# Patient Record
Sex: Male | Born: 1970 | Race: White | Hispanic: No | Marital: Married | Smoking: Never smoker
Health system: Southern US, Community
[De-identification: ages and names within clinical notes are randomized; demographics above are authoritative.]

---

## 2005-01-03 ENCOUNTER — Ambulatory Visit (HOSPITAL_COMMUNITY): Admission: RE | Admit: 2005-01-03 | Discharge: 2005-01-03 | Payer: Self-pay | Admitting: Family Medicine

## 2005-01-29 ENCOUNTER — Emergency Department (HOSPITAL_COMMUNITY): Admission: EM | Admit: 2005-01-29 | Discharge: 2005-01-29 | Payer: Self-pay | Admitting: *Deleted

## 2006-12-12 ENCOUNTER — Ambulatory Visit: Payer: Self-pay | Admitting: Family Medicine

## 2006-12-12 ENCOUNTER — Ambulatory Visit (HOSPITAL_COMMUNITY): Admission: RE | Admit: 2006-12-12 | Discharge: 2006-12-12 | Payer: Self-pay | Admitting: Family Medicine

## 2006-12-12 ENCOUNTER — Telehealth (INDEPENDENT_AMBULATORY_CARE_PROVIDER_SITE_OTHER): Payer: Self-pay | Admitting: *Deleted

## 2006-12-12 DIAGNOSIS — F329 Major depressive disorder, single episode, unspecified: Secondary | ICD-10-CM

## 2006-12-12 DIAGNOSIS — F411 Generalized anxiety disorder: Secondary | ICD-10-CM | POA: Insufficient documentation

## 2006-12-12 DIAGNOSIS — R5383 Other fatigue: Secondary | ICD-10-CM

## 2006-12-12 DIAGNOSIS — E785 Hyperlipidemia, unspecified: Secondary | ICD-10-CM

## 2006-12-12 DIAGNOSIS — K219 Gastro-esophageal reflux disease without esophagitis: Secondary | ICD-10-CM

## 2006-12-12 DIAGNOSIS — M25569 Pain in unspecified knee: Secondary | ICD-10-CM

## 2006-12-12 DIAGNOSIS — J45909 Unspecified asthma, uncomplicated: Secondary | ICD-10-CM | POA: Insufficient documentation

## 2006-12-12 DIAGNOSIS — I10 Essential (primary) hypertension: Secondary | ICD-10-CM | POA: Insufficient documentation

## 2006-12-12 DIAGNOSIS — R5381 Other malaise: Secondary | ICD-10-CM

## 2006-12-12 DIAGNOSIS — K589 Irritable bowel syndrome without diarrhea: Secondary | ICD-10-CM

## 2006-12-12 DIAGNOSIS — F3289 Other specified depressive episodes: Secondary | ICD-10-CM | POA: Insufficient documentation

## 2006-12-12 DIAGNOSIS — M129 Arthropathy, unspecified: Secondary | ICD-10-CM | POA: Insufficient documentation

## 2006-12-12 DIAGNOSIS — M2140 Flat foot [pes planus] (acquired), unspecified foot: Secondary | ICD-10-CM | POA: Insufficient documentation

## 2006-12-12 LAB — CONVERTED CEMR LAB
HDL goal, serum: 40 mg/dL
LDL Goal: 130 mg/dL

## 2006-12-13 ENCOUNTER — Telehealth (INDEPENDENT_AMBULATORY_CARE_PROVIDER_SITE_OTHER): Payer: Self-pay | Admitting: *Deleted

## 2006-12-14 ENCOUNTER — Encounter (INDEPENDENT_AMBULATORY_CARE_PROVIDER_SITE_OTHER): Payer: Self-pay | Admitting: Family Medicine

## 2006-12-21 ENCOUNTER — Encounter (INDEPENDENT_AMBULATORY_CARE_PROVIDER_SITE_OTHER): Payer: Self-pay | Admitting: Family Medicine

## 2006-12-23 LAB — CONVERTED CEMR LAB
AST: 46 units/L — ABNORMAL HIGH (ref 0–37)
Basophils Relative: 1 % (ref 0–1)
Calcium: 9.3 mg/dL (ref 8.4–10.5)
Chloride: 104 meq/L (ref 96–112)
Creatinine, Ser: 0.97 mg/dL (ref 0.40–1.50)
Eosinophils Absolute: 0.1 10*3/uL (ref 0.0–0.7)
Eosinophils Relative: 1 % (ref 0–5)
HDL: 49 mg/dL (ref >39)
Lymphs Abs: 3.8 10*3/uL — ABNORMAL HIGH (ref 0.7–3.3)
MCHC: 33.8 g/dL (ref 30.0–36.0)
MCV: 91.1 fL (ref 78.0–100.0)
Monocytes Absolute: 0.6 10*3/uL (ref 0.2–0.7)
Neutro Abs: 3.9 10*3/uL (ref 1.7–7.7)
Neutrophils Relative %: 46 % (ref 43–77)
Sodium: 138 meq/L (ref 135–145)
TSH: 2.591 microintl units/mL (ref 0.350–5.50)
Total CHOL/HDL Ratio: 4.2
Total Protein: 7.1 g/dL (ref 6.0–8.3)
VLDL: 26 mg/dL (ref 0–40)

## 2006-12-24 ENCOUNTER — Telehealth (INDEPENDENT_AMBULATORY_CARE_PROVIDER_SITE_OTHER): Payer: Self-pay | Admitting: Family Medicine

## 2006-12-25 ENCOUNTER — Ambulatory Visit: Payer: Self-pay | Admitting: Family Medicine

## 2006-12-25 ENCOUNTER — Telehealth (INDEPENDENT_AMBULATORY_CARE_PROVIDER_SITE_OTHER): Payer: Self-pay | Admitting: *Deleted

## 2006-12-25 DIAGNOSIS — G43909 Migraine, unspecified, not intractable, without status migrainosus: Secondary | ICD-10-CM | POA: Insufficient documentation

## 2006-12-25 DIAGNOSIS — R945 Abnormal results of liver function studies: Secondary | ICD-10-CM | POA: Insufficient documentation

## 2006-12-26 ENCOUNTER — Telehealth (INDEPENDENT_AMBULATORY_CARE_PROVIDER_SITE_OTHER): Payer: Self-pay | Admitting: *Deleted

## 2006-12-26 ENCOUNTER — Encounter (INDEPENDENT_AMBULATORY_CARE_PROVIDER_SITE_OTHER): Payer: Self-pay | Admitting: Family Medicine

## 2007-01-09 ENCOUNTER — Encounter (INDEPENDENT_AMBULATORY_CARE_PROVIDER_SITE_OTHER): Payer: Self-pay | Admitting: Family Medicine

## 2007-01-25 ENCOUNTER — Telehealth (INDEPENDENT_AMBULATORY_CARE_PROVIDER_SITE_OTHER): Payer: Self-pay | Admitting: *Deleted

## 2007-01-28 ENCOUNTER — Telehealth (INDEPENDENT_AMBULATORY_CARE_PROVIDER_SITE_OTHER): Payer: Self-pay | Admitting: *Deleted

## 2007-02-01 ENCOUNTER — Ambulatory Visit (HOSPITAL_COMMUNITY): Admission: RE | Admit: 2007-02-01 | Discharge: 2007-02-01 | Payer: Self-pay | Admitting: Family Medicine

## 2007-02-01 ENCOUNTER — Telehealth (INDEPENDENT_AMBULATORY_CARE_PROVIDER_SITE_OTHER): Payer: Self-pay | Admitting: *Deleted

## 2007-02-04 ENCOUNTER — Encounter (INDEPENDENT_AMBULATORY_CARE_PROVIDER_SITE_OTHER): Payer: Self-pay | Admitting: Internal Medicine

## 2007-02-08 ENCOUNTER — Ambulatory Visit: Payer: Self-pay | Admitting: Family Medicine

## 2007-02-10 LAB — CONVERTED CEMR LAB
ALT: 100 U/L — ABNORMAL HIGH
AST: 46 U/L — ABNORMAL HIGH
Albumin: 4.3 g/dL
Alkaline Phosphatase: 65 U/L
Bilirubin, Direct: 0.1 mg/dL
Indirect Bilirubin: 0.3 mg/dL
Total Bilirubin: 0.4 mg/dL
Total Protein: 7.1 g/dL

## 2007-02-11 ENCOUNTER — Telehealth (INDEPENDENT_AMBULATORY_CARE_PROVIDER_SITE_OTHER): Payer: Self-pay | Admitting: *Deleted

## 2007-02-11 ENCOUNTER — Encounter (INDEPENDENT_AMBULATORY_CARE_PROVIDER_SITE_OTHER): Payer: Self-pay | Admitting: Family Medicine

## 2007-03-17 ENCOUNTER — Encounter (INDEPENDENT_AMBULATORY_CARE_PROVIDER_SITE_OTHER): Payer: Self-pay | Admitting: Family Medicine

## 2007-04-26 ENCOUNTER — Ambulatory Visit: Payer: Self-pay | Admitting: Family Medicine

## 2007-12-16 ENCOUNTER — Encounter (INDEPENDENT_AMBULATORY_CARE_PROVIDER_SITE_OTHER): Payer: Self-pay | Admitting: Family Medicine

## 2007-12-25 LAB — CONVERTED CEMR LAB
ALT: 137 units/L — ABNORMAL HIGH (ref 0–53)
AST: 96 units/L — ABNORMAL HIGH (ref 0–37)
Albumin: 4.3 g/dL (ref 3.5–5.2)
BUN: 10 mg/dL (ref 6–23)
Calcium: 9.4 mg/dL (ref 8.4–10.5)
Chloride: 102 meq/L (ref 96–112)
Cholesterol: 254 mg/dL — ABNORMAL HIGH (ref 0–200)
Glucose, Bld: 64 mg/dL — ABNORMAL LOW (ref 70–99)
HDL: 46 mg/dL (ref >39)
Sodium: 139 meq/L (ref 135–145)
Total Bilirubin: 0.5 mg/dL (ref 0.3–1.2)
Total CHOL/HDL Ratio: 5.5
Total Protein: 7.2 g/dL (ref 6.0–8.3)

## 2008-01-08 ENCOUNTER — Ambulatory Visit: Payer: Self-pay | Admitting: Family Medicine

## 2008-01-15 ENCOUNTER — Ambulatory Visit (HOSPITAL_COMMUNITY): Admission: RE | Admit: 2008-01-15 | Discharge: 2008-01-15 | Payer: Self-pay | Admitting: Family Medicine

## 2008-01-16 ENCOUNTER — Encounter (INDEPENDENT_AMBULATORY_CARE_PROVIDER_SITE_OTHER): Payer: Self-pay | Admitting: Family Medicine

## 2008-01-21 ENCOUNTER — Telehealth (INDEPENDENT_AMBULATORY_CARE_PROVIDER_SITE_OTHER): Payer: Self-pay | Admitting: *Deleted

## 2008-01-21 ENCOUNTER — Encounter (INDEPENDENT_AMBULATORY_CARE_PROVIDER_SITE_OTHER): Payer: Self-pay | Admitting: Family Medicine

## 2008-01-21 LAB — CONVERTED CEMR LAB
Ceruloplasmin: 26 mg/dL (ref 21–63)
Saturation Ratios: 25 % (ref 20–55)
UIBC: 212 ug/dL

## 2008-01-31 ENCOUNTER — Ambulatory Visit: Payer: Self-pay | Admitting: Internal Medicine

## 2008-02-05 ENCOUNTER — Ambulatory Visit: Payer: Self-pay | Admitting: Family Medicine

## 2008-04-06 ENCOUNTER — Telehealth (INDEPENDENT_AMBULATORY_CARE_PROVIDER_SITE_OTHER): Payer: Self-pay | Admitting: *Deleted

## 2008-04-13 ENCOUNTER — Ambulatory Visit: Payer: Self-pay | Admitting: Family Medicine

## 2008-04-13 DIAGNOSIS — E669 Obesity, unspecified: Secondary | ICD-10-CM | POA: Insufficient documentation

## 2008-05-04 ENCOUNTER — Encounter (INDEPENDENT_AMBULATORY_CARE_PROVIDER_SITE_OTHER): Payer: Self-pay | Admitting: Family Medicine

## 2008-05-25 ENCOUNTER — Ambulatory Visit: Payer: Self-pay | Admitting: Family Medicine

## 2008-05-26 ENCOUNTER — Encounter (INDEPENDENT_AMBULATORY_CARE_PROVIDER_SITE_OTHER): Payer: Self-pay | Admitting: Family Medicine

## 2008-06-01 ENCOUNTER — Encounter (INDEPENDENT_AMBULATORY_CARE_PROVIDER_SITE_OTHER): Payer: Self-pay | Admitting: *Deleted

## 2008-06-01 LAB — CONVERTED CEMR LAB
ALT: 44 units/L (ref 0–53)
AST: 20 units/L (ref 0–37)
Calcium: 9.1 mg/dL (ref 8.4–10.5)
Creatinine, Ser: 0.83 mg/dL (ref 0.40–1.50)
Sodium: 142 meq/L (ref 135–145)
Total Protein: 7.1 g/dL (ref 6.0–8.3)

## 2008-09-22 IMAGING — CR DG KNEE COMPLETE 4+V*L*
4 series · 4 of 4 positions shown · non-contrast
Comparison: none

HISTORY: Bilateral knee pain

LEFT KNEE 4 VIEWS:
Mild medial compartment joint space narrowing.
Bone mineralization normal.
No fracture, dislocation, or bone destruction.
Slight patellofemoral joint space narrowing and question minimal knee joint
effusion.

[view not recorded (1 of 4)]
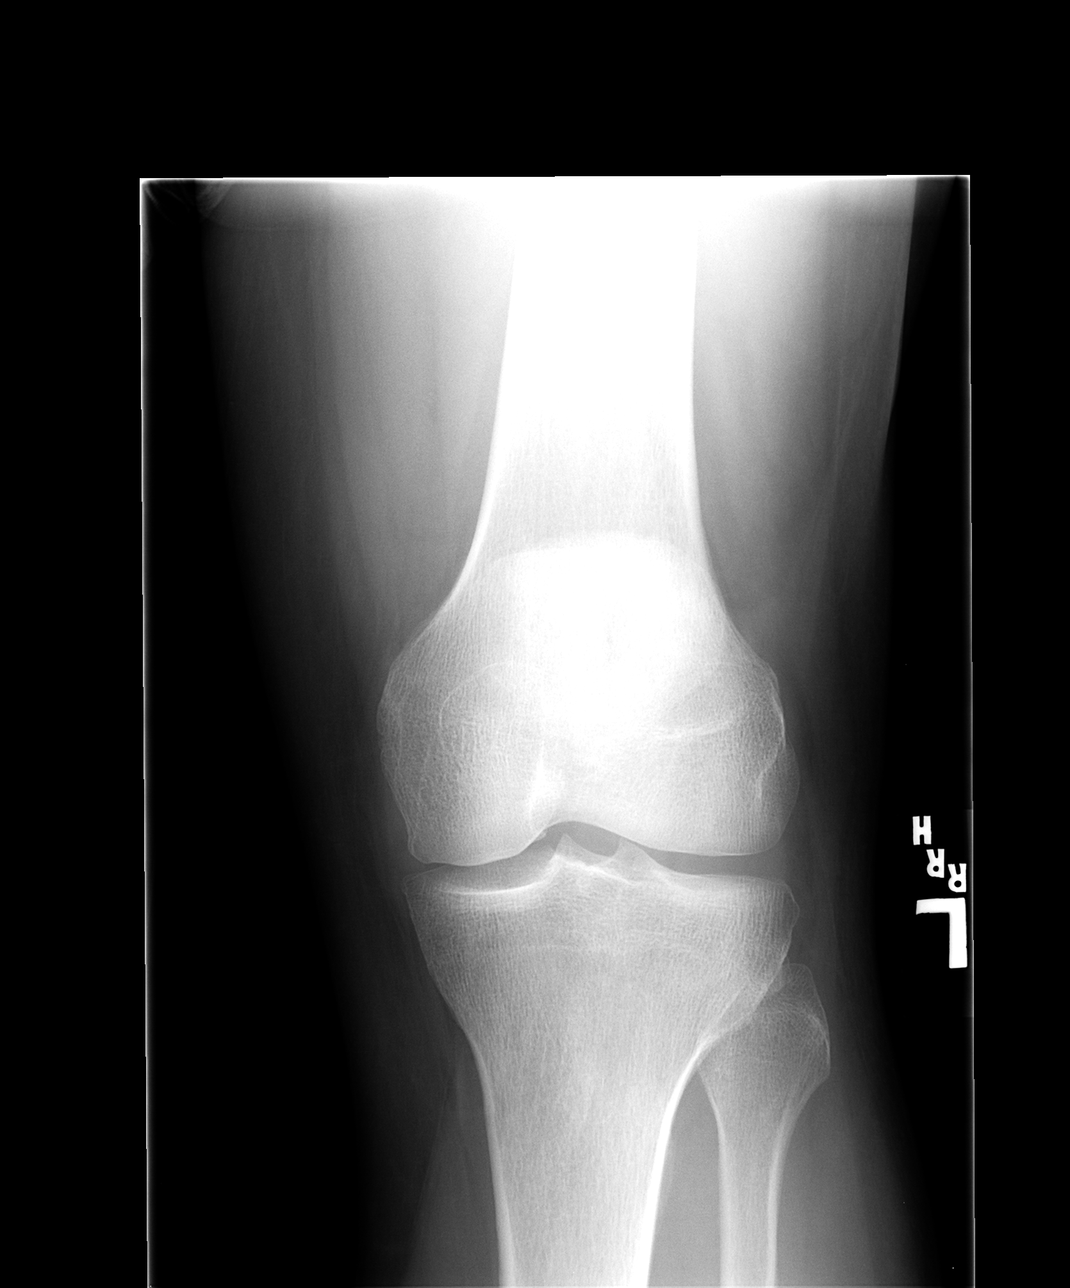

[view not recorded (2 of 4)]
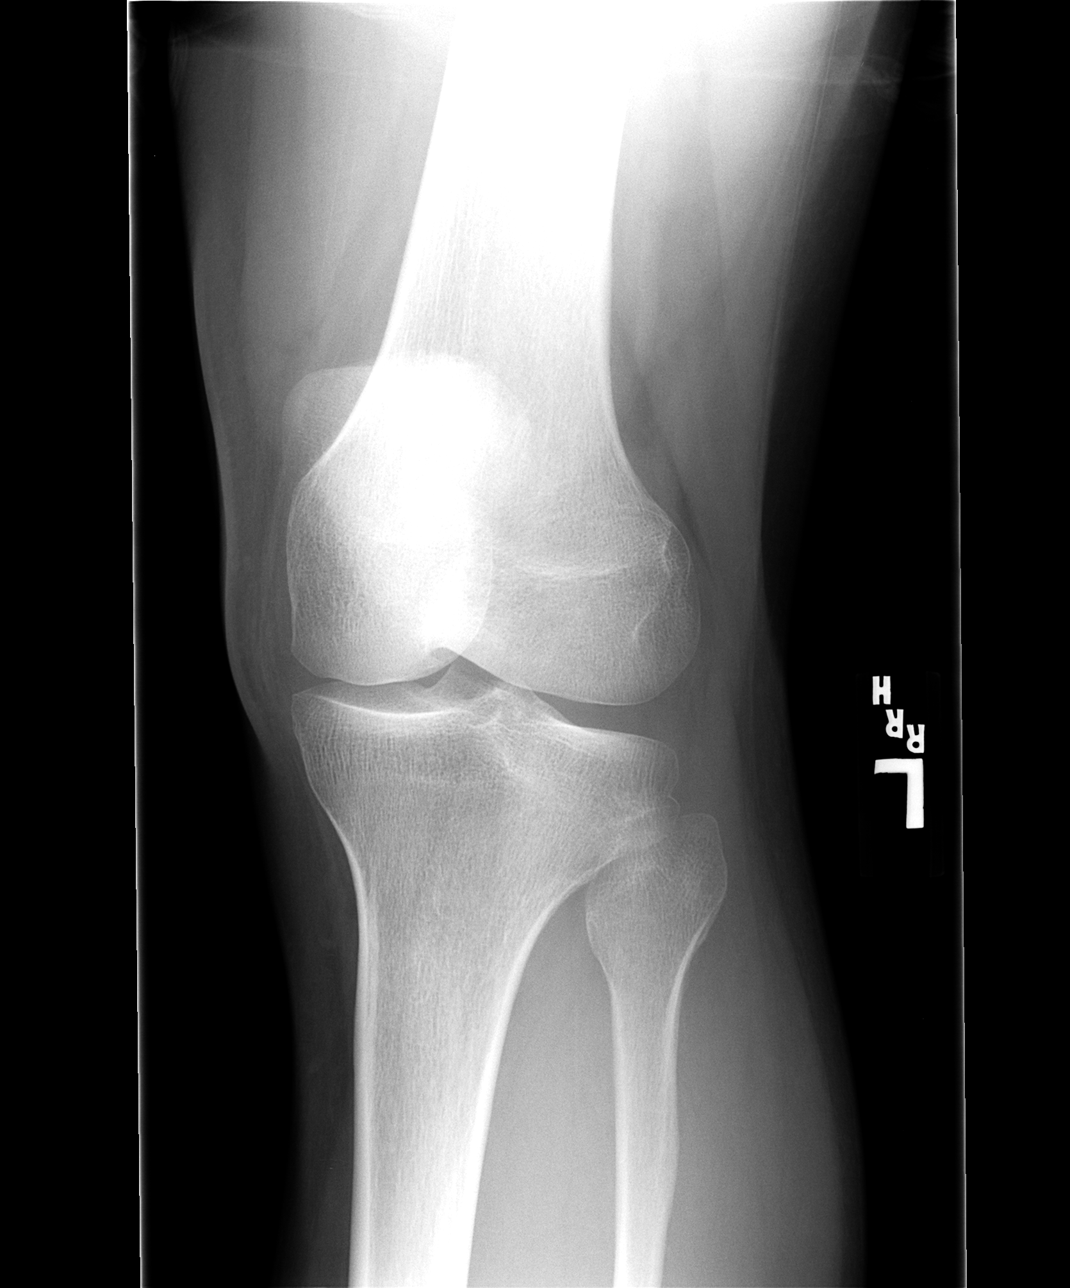

[view not recorded (3 of 4)]
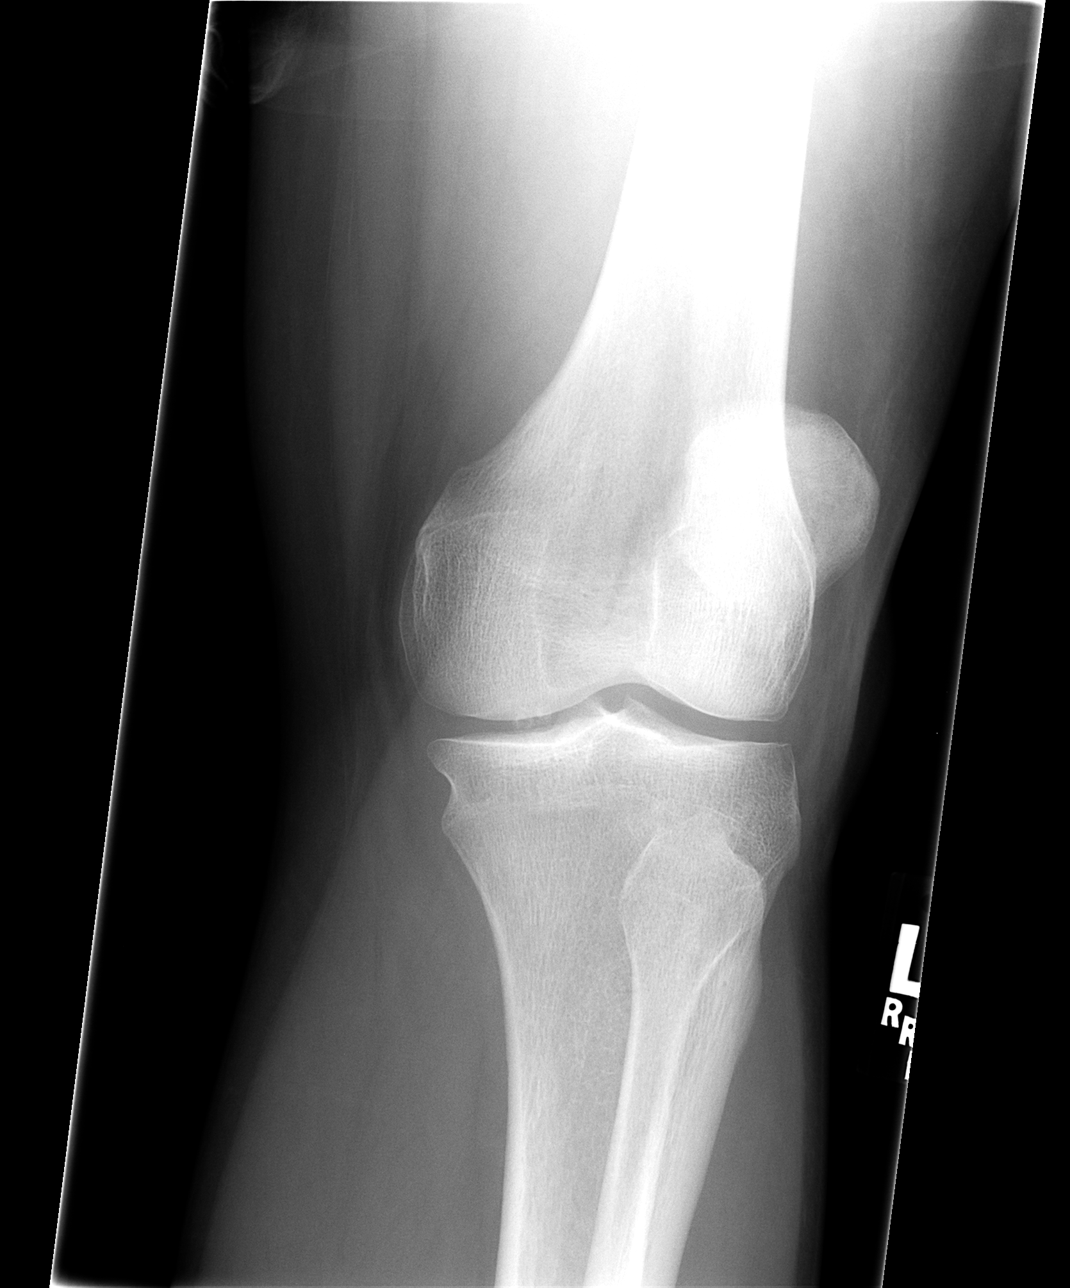

[view not recorded (4 of 4)]
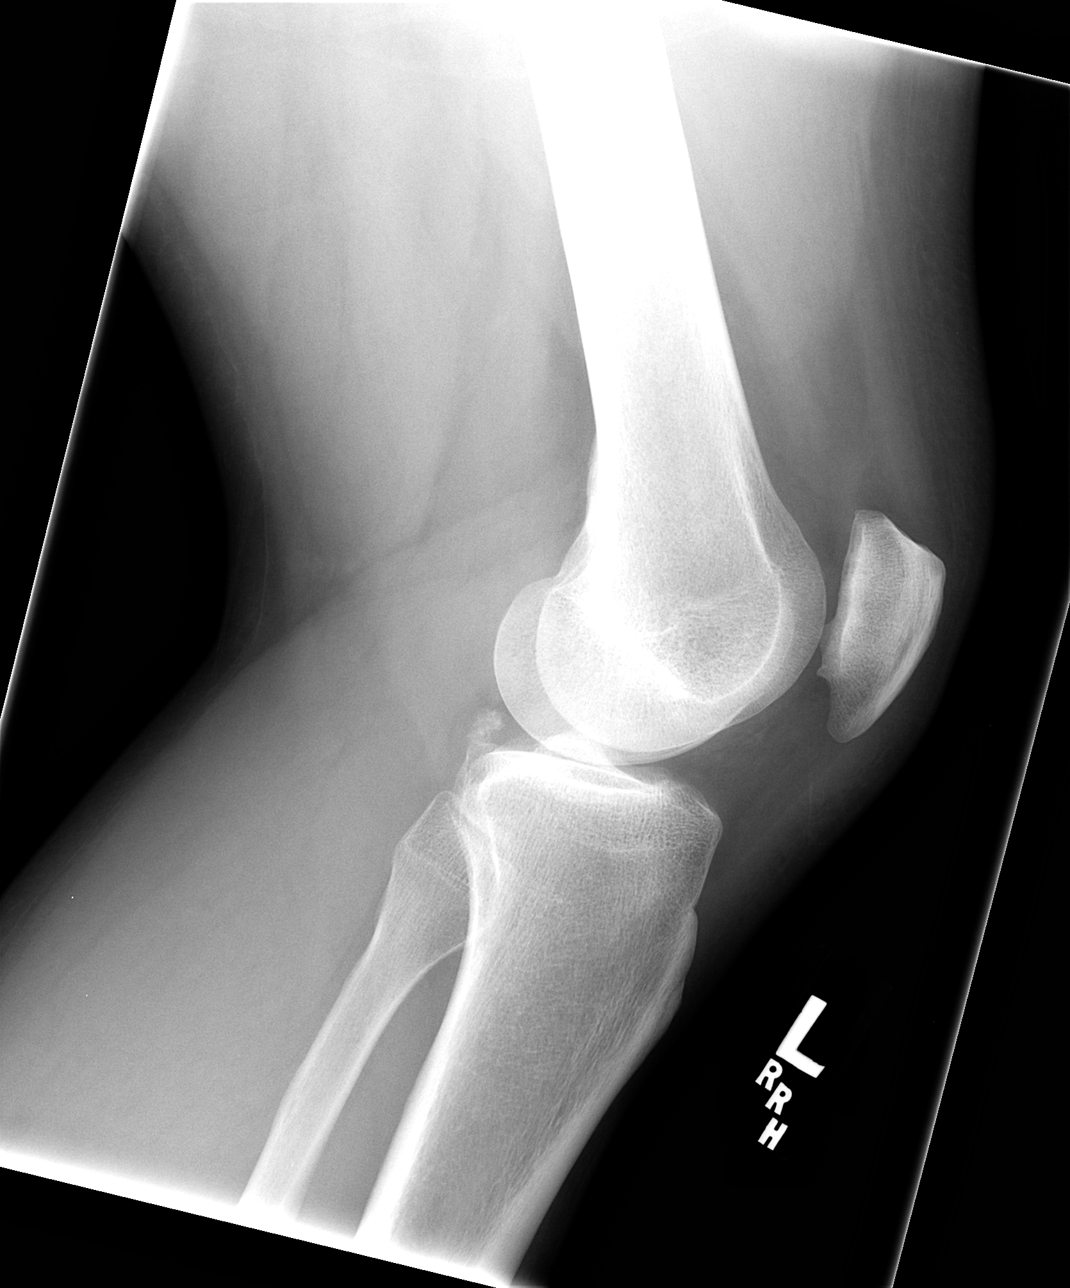

[4 of 4 positions shown; findings below may reference images not displayed]

IMPRESSION: Minimal degenerative changes left knee.

RIGHT KNEE 4 VIEWS:

Mild medial compartment joint space narrowing.
Mineralization normal.
No fracture, dislocation, or bone destruction.
No definite knee joint effusion or soft tissue abnormality.
IMPRESSION: Mild joint space narrowing medial compartment.

## 2008-11-09 ENCOUNTER — Ambulatory Visit: Payer: Self-pay | Admitting: Family Medicine

## 2008-11-10 ENCOUNTER — Encounter (INDEPENDENT_AMBULATORY_CARE_PROVIDER_SITE_OTHER): Payer: Self-pay | Admitting: Family Medicine

## 2008-11-11 LAB — CONVERTED CEMR LAB
Alkaline Phosphatase: 58 units/L (ref 39–117)
BUN: 12 mg/dL (ref 6–23)
Basophils Absolute: 0 10*3/uL (ref 0.0–0.1)
CO2: 21 meq/L (ref 19–32)
Creatinine, Ser: 0.89 mg/dL (ref 0.40–1.50)
Eosinophils Absolute: 0.1 10*3/uL (ref 0.0–0.7)
Eosinophils Relative: 1 % (ref 0–5)
HCT: 47.4 % (ref 39.0–52.0)
HDL: 47 mg/dL (ref >39)
Lymphocytes Relative: 34 % (ref 12–46)
Lymphs Abs: 2.1 10*3/uL (ref 0.7–4.0)
MCHC: 33.1 g/dL (ref 30.0–36.0)
Neutro Abs: 3.5 10*3/uL (ref 1.7–7.7)
Platelets: 241 10*3/uL (ref 150–400)
RDW: 12.9 % (ref 11.5–15.5)
TSH: 1.081 microintl units/mL (ref 0.350–4.500)
Total CHOL/HDL Ratio: 3.6
Total Protein: 7.4 g/dL (ref 6.0–8.3)
WBC: 6.1 10*3/uL (ref 4.0–10.5)

## 2008-11-12 IMAGING — CT CT HEAD W/O CM
1 series · 16 of 30 positions shown, 20 images · IV contrast (agent unspecified)
Comparison: none

HISTORY: Headaches, migraines

CT HEAD WITHOUT CONTRAST:
Routine noncontrast CT head without priors for comparison.
Normal ventricular morphology.
No midline shift or mass-effect.
Normal appearance of brain parenchyma.
No intracranial mass, hemorrhage, or infarction.
Visualized sinuses clear.
Bones unremarkable.

[Series 2: headseq 4.8 h37s · axial · 0.43mm/px · z∈[+71,+223]mm · 16 of 36 slices shown, 20 images]
[im 2/36  brain]
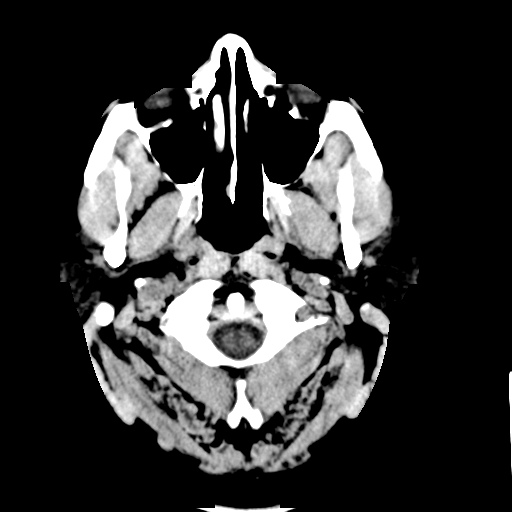
[im 2/36  bone]
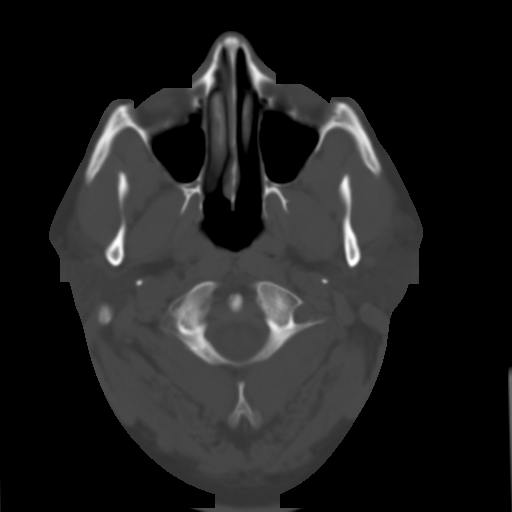
[im 4/36  brain]
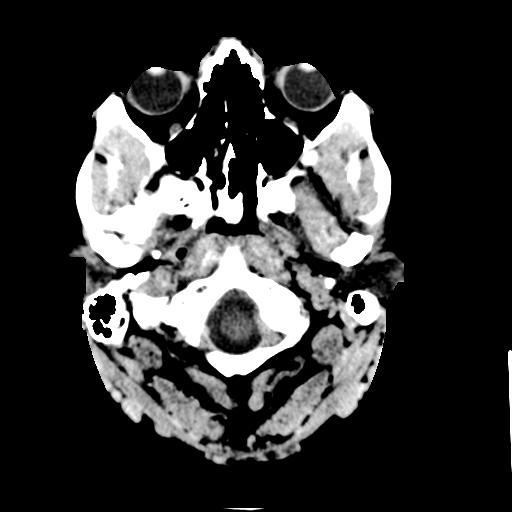
[im 7/36  brain]
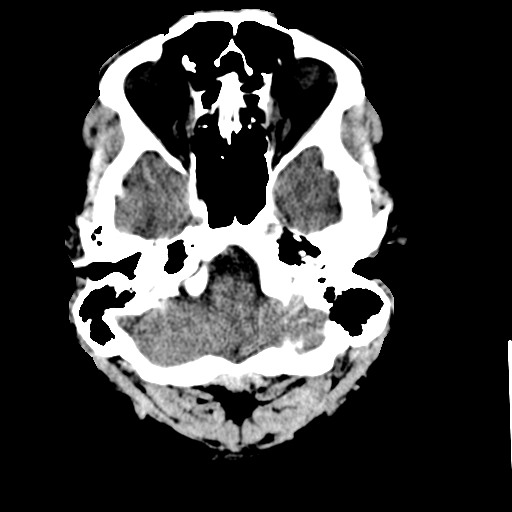
[im 9/36  brain]
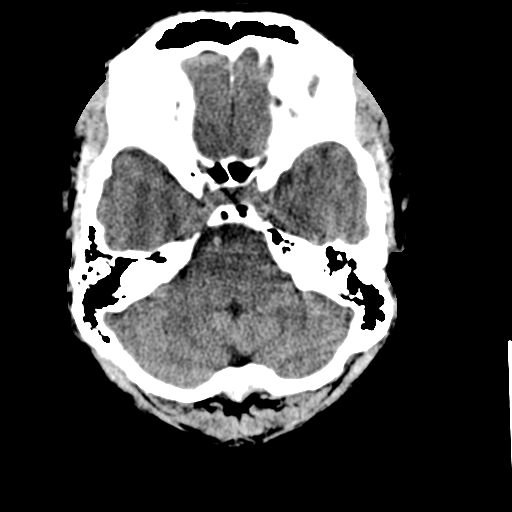
[im 10/36  brain]
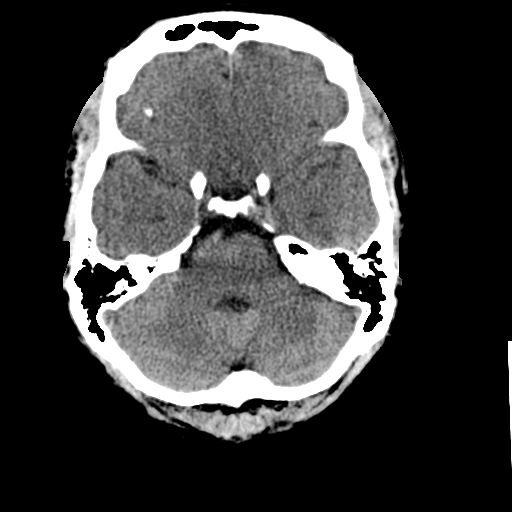
[im 10/36  bone]
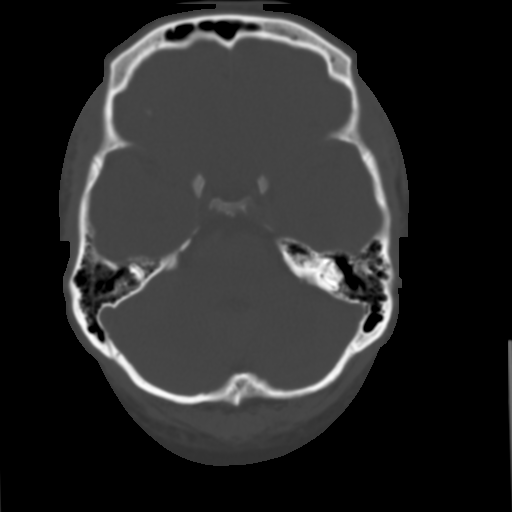
[im 13/36  brain]
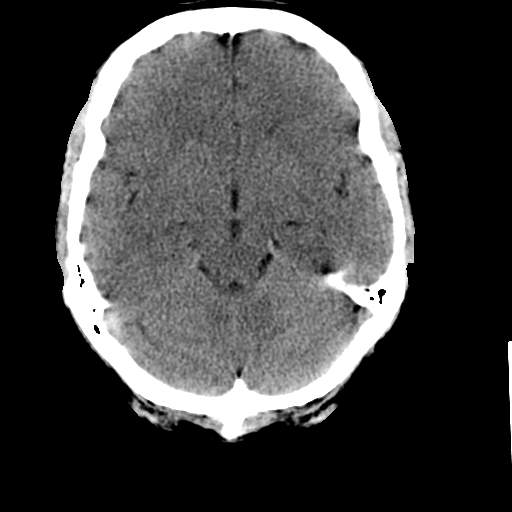
[im 15/36  brain]
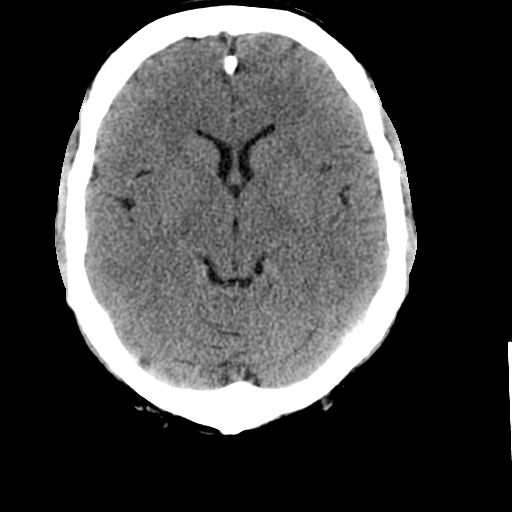
[im 17/36  brain]
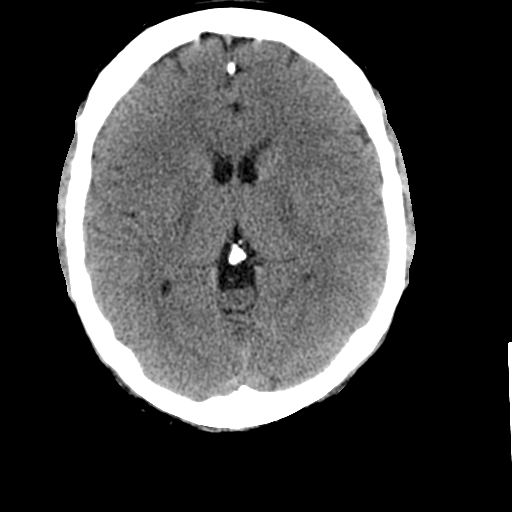
[im 19/36  brain]
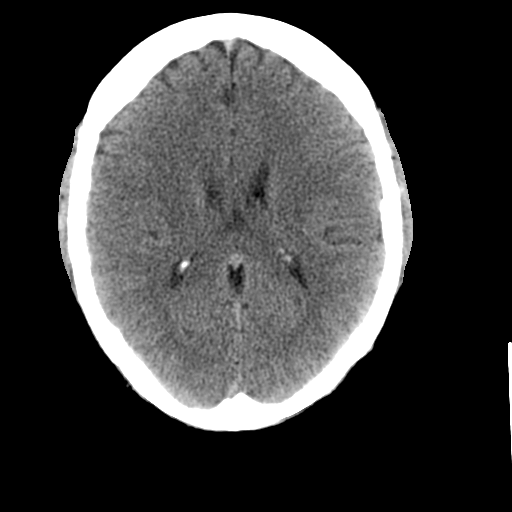
[im 19/36  bone]
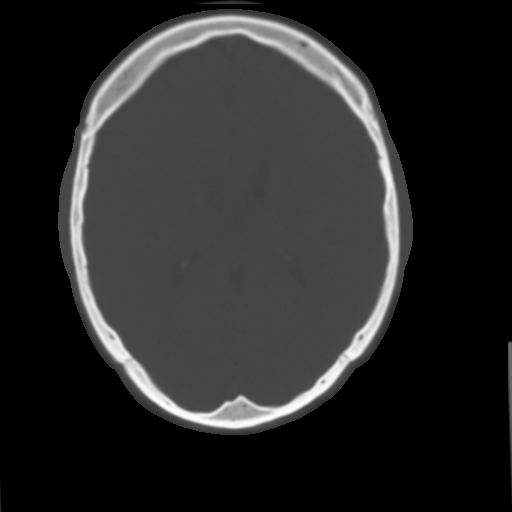
[im 21/36  brain]
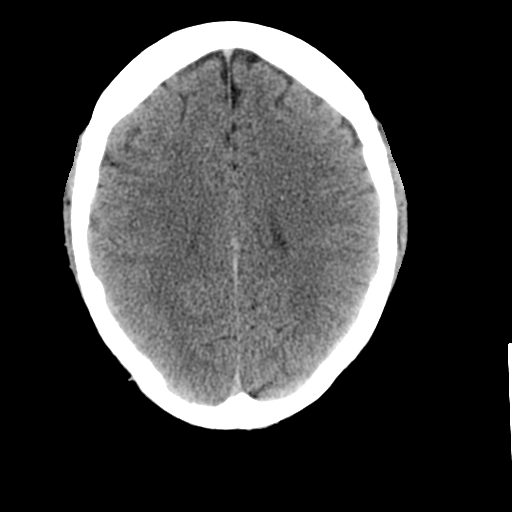
[im 23/36  brain]
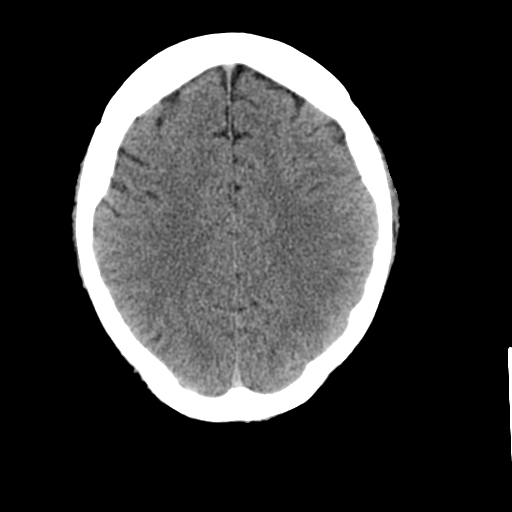
[im 26/36  brain]
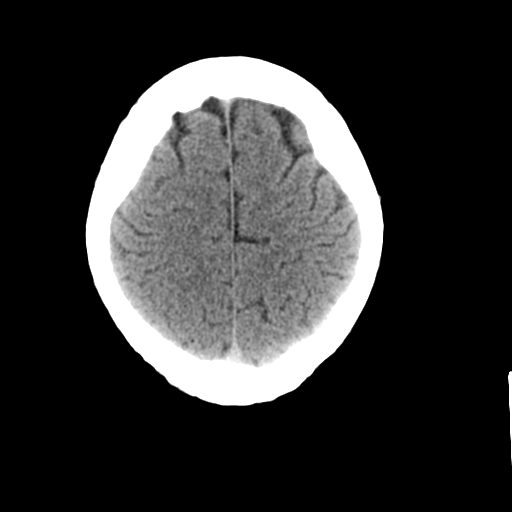
[im 27/36  brain]
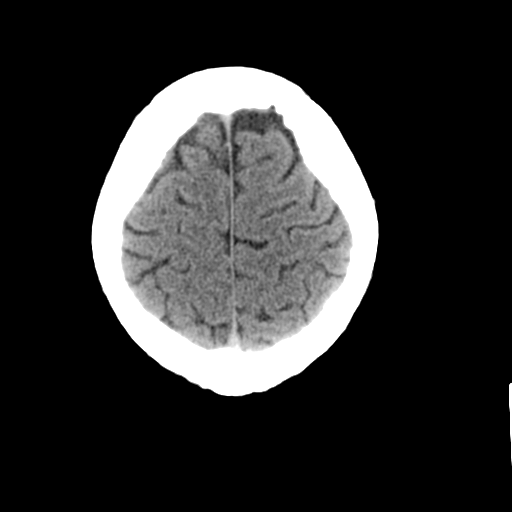
[im 27/36  bone]
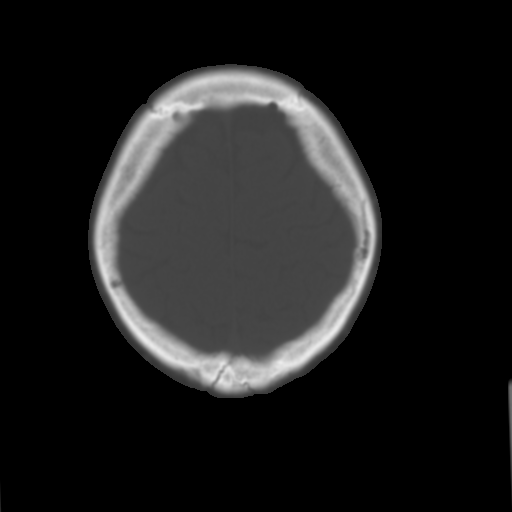
[im 29/36  brain]
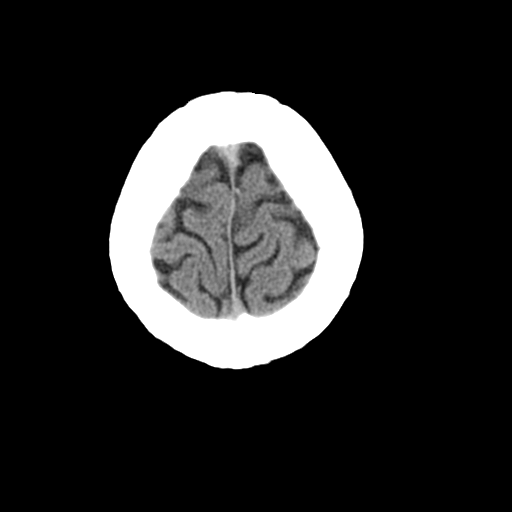
[im 32/36  brain]
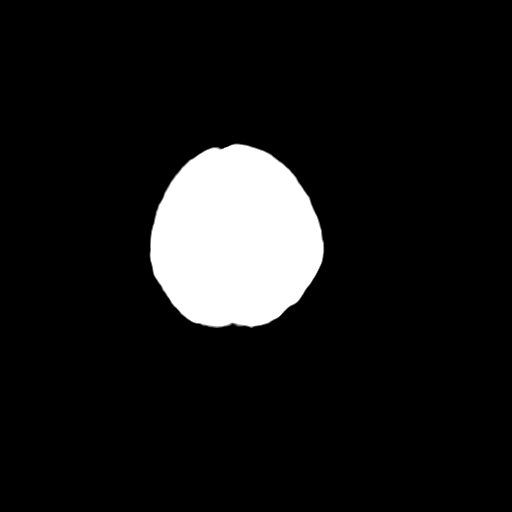
[im 34/36  brain]
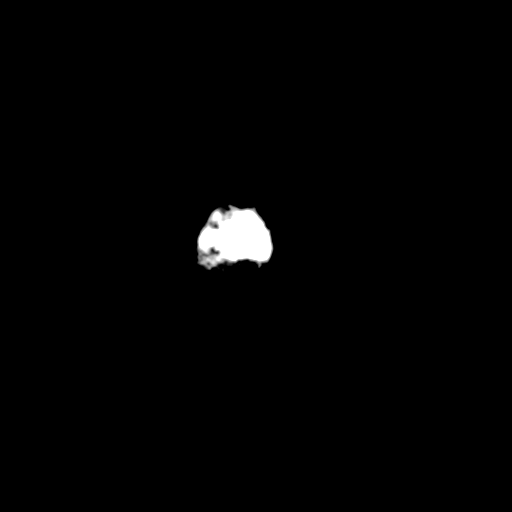

[16 of 30 positions shown; findings below may reference images not displayed]

IMPRESSION: No acute intracranial abnormality.

## 2010-06-05 ENCOUNTER — Encounter: Payer: Self-pay | Admitting: Family Medicine

## 2010-09-27 NOTE — Assessment & Plan Note (Signed)
Brian Powers, Brian Powers                CHART#:  [id number]   DATE:  01/31/2008                       DOB:  12-27-1970   REASON FOR CONSULTATION:  Elevated LFTs.   PHYSICIAN REQUESTING CONSULTATION:  Franchot Heidelberg, MD.   HISTORY OF PRESENT ILLNESS:  The patient is a 40 year old Caucasian  gentleman who presents today for further evaluation of elevated LFTs.  He states that he was first told his LFTs were elevated back around  November 2008.  At that time, he states he was taking a lot of Aleve,  which he stopped.  They have been rechecked, since that time they only  continued to increase.  In the last 10 months, he has gained about 15  pounds.  He also has hypercholesterolemia, which he is trying to manage  with his diet.  He denies any abdominal pain.  Bowel movements are  regular.  No blood in the stool.  He has a rare heartburn.  He denies  any dysphagia or odynophagia.  Denies any herbal medications or NSAIDs.  He has never received a blood transfusion.  He does not have any tattoos  and has never used any illicit drugs.  He donated blood about 10 years  ago, but never received rejection letter.   Workup thus far has included an abdominal ultrasound, which revealed  fatty liver.  He has had multiple labs as outlined below.   CURRENT MEDICATIONS:  Inderal 60 mg daily and Maxalt 10 mg p.r.n.   ALLERGIES:  No known drug allergies.   PAST MEDICAL HISTORY:  Hypertension and migraine headaches.   PAST SURGICAL HISTORY:  Negative.   FAMILY HISTORY:  Father deceased in his 50s due to HIV/AIDS.  No family  history of colorectal cancer.   SOCIAL HISTORY:  He is married.  He has an adopted child.  He is  employed in a [employer] features editor.  He is a nonsmoker.  He was never a heavy  drinker.  He is currently a nondrinker.   REVIEW OF SYSTEMS:  See HPI for GI.  Constitutional:  See HPI.  Cardiopulmonary:  No chest pain, shortness of breath, palpitations, or  cough.  Genitourinary:  No  dysuria or  hematuria.   PHYSICAL EXAMINATION:  VITAL SIGNS:  Weight 231, height 5 feet 8,  temperature 98.4, blood pressure 150/92, and pulse 56.  GENERAL:  Pleasant, moderately obese Caucasian male, in no acute  distress.  SKIN:  Warm and dry.  No jaundice.  HEENT:  Sclerae nonicteric.  Oropharyngeal mucosa moist and pink.  No  lesions, erythema, or exudate.  No lymphadenopathy or thyromegaly.  CHEST:  Lungs are clear to auscultation.  CARDIAC:  Regular rate and rhythm.  Normal S1 and S2.  No murmurs, rubs,  or gallops.  ABDOMEN:  Positive bowel sounds.  Abdomen soft, obese, and nontender.  No organomegaly or masses.  No rebound or guarding.  No abdominal bruits  or hernias.  LOWER EXTREMITIES:  No edema.   LABORATORY DATA:  Iron 70 and ferritin elevated at 464.  Hepatitis B  surface antigen negative and hepatitis B surface antibody negative.  Ceruloplasmin normal at 26, ANA negative, anti-smooth muscle antibody  IgG negative.  RIBA and HCV antibody negative.  Total bilirubin 0.5,  alkaline phosphatase 63, AST 96, ALT 137, albumin 4.3, total cholesterol  254, triglycerides 172, and LDL 174.   IMPRESSION:  The patient is a 40 year old gentleman with mild  transaminitis.  Abdominal ultrasound revealed fatty liver.  I suspect,  he does have steatohepatitis related to his obesity.  We discussed fatty  infiltration of the liver/steatohepatitis today.  He really needs to  work on his weight.  Recommend 10- to 15-pound weight loss over the next  3 months.  Encouraged aerobic exercise 3 times a week begin 10 minutes a  day and working his way up to 20 minutes a day.  Encouraged low-fat  diet.  He has significantly elevated LDL as well, but at this point in  time, we would avoid statin until we have a better understanding of  stability of his LFTs.  I need to see a copy of his old labs for  comparison as well.   PLAN:  1. A 10- to 15-pound weight loss over the next 3 months, low-fat  diet,      and exercise as above.  2. Obtain old labs.  3. We will check an AMA, HCV antibody.  4. Recheck his LFTs and ferritin in 3 months.  5. Office visit in 3 months.  6. Fatty liver/steatohepatitis literature provided to the patient.   Addendum:  Received old labs.  On 02/08/07, AST 46 and ALT 100,  LDL 133.  On 01/31/08, HCV Ab negative, Immunoglobulins normal, AMA negative.       Tana Coast, P.A.  Electronically Signed     R. Roetta Sessions, M.D.  Electronically Signed   LL/MEDQ  D:  01/31/2008  T:  01/31/2008  Job:  [id number]   cc:   Franchot Heidelberg, M.D.

## 2022-04-20 ENCOUNTER — Encounter: Payer: Self-pay | Admitting: *Deleted

## 2022-06-02 ENCOUNTER — Other Ambulatory Visit: Payer: Self-pay

## 2022-06-02 ENCOUNTER — Ambulatory Visit
Admission: RE | Admit: 2022-06-02 | Discharge: 2022-06-02 | Disposition: A | Discharge status: Home / Self Care | Payer: BC Managed Care – PPO | Source: Ambulatory Visit | Attending: Emergency Medicine | Admitting: Emergency Medicine

## 2022-06-02 VITALS — BP 149/83 | HR 67 | Temp 97.3°F | Resp 20

## 2022-06-02 DIAGNOSIS — J0100 Acute maxillary sinusitis, unspecified: Secondary | ICD-10-CM | POA: Diagnosis not present

## 2022-06-02 DIAGNOSIS — H66001 Acute suppurative otitis media without spontaneous rupture of ear drum, right ear: Secondary | ICD-10-CM

## 2022-06-02 MED ORDER — PROMETHAZINE-DM 6.25-15 MG/5ML PO SYRP: 5.0000 mL | ORAL_SOLUTION | Freq: Four times a day (QID) | ORAL | 0 refills | Status: AC | PRN

## 2022-06-02 MED ORDER — FLUTICASONE PROPIONATE 50 MCG/ACT NA SUSP: 2.0000 | Freq: Every day | NASAL | 0 refills | Status: AC

## 2022-06-02 MED ORDER — AMOXICILLIN-POT CLAVULANATE 875-125 MG PO TABS: 1.0000 | ORAL_TABLET | Freq: Two times a day (BID) | ORAL | 0 refills | Status: AC

## 2022-06-02 NOTE — ED Triage Notes (Signed)
Pt reports cough, sore throat,nasal congestion and bilateral ear pain for last several weeks. No change in symptoms with otc cold and flu medication.

## 2022-06-02 NOTE — Discharge Instructions (Addendum)
Finish the Augmentin, even if you feel better., Promethazine DM for the cough at night, Flonase, start saline nasal irrigation with your NeilMed sinus rinse and distilled water as often as you want, and Mucinex.

## 2022-06-02 NOTE — ED Provider Notes (Signed)
HPI  SUBJECTIVE:  Brian Powers is a 52 y.o. male who presents with 6 days sore throat, nasal congestion, bilateral ear pressure/discomfort worse on the right, decreased hearing, maxillary pressure, upper dental pain.  No fevers, postnasal drip, rhinorrhea, otorrhea, tinnitus, vertigo, facial swelling.  He has also had a cough for the past 4 weeks after having an upper respiratory infection that is occasionally productive of light green sputum.  This is decreasing in amount.  No wheezing, shortness of breath, dyspnea on exertion, GERD symptoms.  She he is unable to sleep at night because of the cough.  Antibiotics in the past month.  No antipyretic in the past 6 hours.  He tried cold and flu medications, hot showers without improvement in his symptoms.  His cough is worse with lying down.  No known strep exposure.  He has a past medical history of diet-controlled GERD.  Denies history of asthma, diabetes, hypertension.  PCP: Cannot remember.   History reviewed. No pertinent past medical history.  History reviewed. No pertinent surgical history.  History reviewed. No pertinent family history.  Social History   Tobacco Use   Smoking status: Never   Smokeless tobacco: Never  Substance Use Topics   Alcohol use: Never   Drug use: Never    No current facility-administered medications for this encounter.  Current Outpatient Medications:    amoxicillin-clavulanate (AUGMENTIN) 875-125 MG tablet, Take 1 tablet by mouth every 12 (twelve) hours., Disp: 14 tablet, Rfl: 0   fluticasone (FLONASE) 50 MCG/ACT nasal spray, Place 2 sprays into both nostrils daily., Disp: 16 g, Rfl: 0   promethazine-dextromethorphan (PROMETHAZINE-DM) 6.25-15 MG/5ML syrup, Take 5 mLs by mouth 4 (four) times daily as needed for cough., Disp: 118 mL, Rfl: 0  Not on File   ROS  As noted in HPI.   Physical Exam  BP (!) 149/83 (BP Location: Right Arm)   Pulse 67   Temp (!) 97.3 F (36.3 C) (Oral)   Resp 20   SpO2  96%   Constitutional: Well developed, well nourished, no acute distress Eyes:  EOMI, conjunctiva normal bilaterally HENT: Normocephalic, atraumatic,mucus membranes moist.  Right TM dull, erythematous, bulging.  Decreased hearing right ear compared to the left.  Positive purulent nasal congestion.  Erythematous,swollen turbinates.  No maxillary, frontal sinus tenderness.  Normal oropharynx.  Normal tonsils without exudates.  Uvula midline.  No obvious postnasal drip. Neck: No cervical lymphadenopathy Respiratory: Normal inspiratory effort, lungs clear bilaterally.  No anterior, lateral chest wall tenderness Cardiovascular: Normal rate, regular rhythm, no murmurs, rubs, gallops GI: nondistended skin: No rash, skin intact Musculoskeletal: no deformities Neurologic: Alert & oriented x 3, no focal neuro deficits Psychiatric: Speech and behavior appropriate   ED Course   Medications - No data to display  No orders of the defined types were placed in this encounter.   No results found for this or any previous visit (from the past 24 hour(s)). No results found.  ED Clinical Impression  1. Non-recurrent acute suppurative otitis media of right ear without spontaneous rupture of tympanic membrane   2. Acute non-recurrent maxillary sinusitis      ED Assessment/Plan     Patient presents with a right otitis media and maxillary sinusitis.  Home with Augmentin for 7 days, Promethazine DM for the cough at night, Flonase, start saline nasal irrigation, and Mucinex.  Lungs are clear, he is satting well on room air, I suspect cough is more from postnasal drip or postviral cough syndrome rather than  a pneumonia.  Follow-up with PCP if not better after finishing the antibiotics.   Discussed MDM, treatment plan, and plan for follow-up with patient.  patient agrees with plan.   Meds ordered this encounter  Medications   fluticasone (FLONASE) 50 MCG/ACT nasal spray    Sig: Place 2 sprays into  both nostrils daily.    Dispense:  16 g    Refill:  0   amoxicillin-clavulanate (AUGMENTIN) 875-125 MG tablet    Sig: Take 1 tablet by mouth every 12 (twelve) hours.    Dispense:  14 tablet    Refill:  0   promethazine-dextromethorphan (PROMETHAZINE-DM) 6.25-15 MG/5ML syrup    Sig: Take 5 mLs by mouth 4 (four) times daily as needed for cough.    Dispense:  118 mL    Refill:  0      *This clinic note was created using [employer]. Therefore, there may be occasional mistakes despite careful proofreading.  ?    Melynda Ripple, MD 06/02/22 [phone]

## 2022-06-18 ENCOUNTER — Encounter: Payer: Self-pay | Admitting: Emergency Medicine

## 2022-06-18 ENCOUNTER — Ambulatory Visit
Admission: EM | Admit: 2022-06-18 | Discharge: 2022-06-18 | Disposition: A | Discharge status: Home / Self Care | Payer: BC Managed Care – PPO | Attending: Nurse Practitioner | Admitting: Nurse Practitioner

## 2022-06-18 DIAGNOSIS — H6991 Unspecified Eustachian tube disorder, right ear: Secondary | ICD-10-CM | POA: Diagnosis not present

## 2022-06-18 MED ORDER — PREDNISONE 20 MG PO TABS: 40.0000 mg | ORAL_TABLET | Freq: Every day | ORAL | 0 refills | Status: AC

## 2022-06-18 NOTE — Discharge Instructions (Addendum)
Take medication as prescribed. May take over-the-counter Tylenol or ibuprofen as needed for pain or discomfort. Warm compresses to the affected ear help with comfort. Do not stick anything inside the ear while symptoms persist. Avoid getting water inside of the ear while symptoms persist. If symptoms fail to improve with this treatment, please follow-up with your primary care physician for further evaluation. Follow-up as needed.

## 2022-06-18 NOTE — ED Triage Notes (Signed)
Hx of ear infection on 1/19.  States right ear continues hurt and has a cough.  Has been takin ibuprofen and naprosyn

## 2022-06-18 NOTE — ED Provider Notes (Signed)
[facility]    CSN: [id number] Arrival date & time: 06/18/22  1017      History   Chief Complaint No chief complaint on file.   HPI Brian Powers is a 52 y.o. male.   The history is provided by the patient.   Patient presents for complaints of right ear pain.  Patient was seen on 06/02/2022 and diagnosed with a right otitis media.  He states that he completed the Augmentin previously prescribed and use fluticasone a few days for his symptoms.  He states over the last several days, he has had worsening right ear pain.  He states that the pain comes and goes and it feels like a "throbbing or shooting" pain.  Patient also states that his hearing continues to be decreased.  He states that he continues to have a mild cough and intermittent sore throat.  Patient denies fever, chills, headache, wheezing, shortness of breath, difficulty breathing, or GI symptoms.  History reviewed. No pertinent past medical history.  Patient Active Problem List   Diagnosis Date Noted   OBESITY 04/13/2008   MIGRAINE HEADACHE 12/25/2006   LIVER FUNCTION TESTS, ABNORMAL 12/25/2006   HYPERLIPIDEMIA 12/12/2006   ANXIETY 12/12/2006   DEPRESSION 12/12/2006   HYPERTENSION 12/12/2006   ASTHMA 12/12/2006   GERD 12/12/2006   IBS 12/12/2006   ARTHRITIS 12/12/2006   KNEE PAIN 12/12/2006   FLAT FOOT 12/12/2006   MALAISE AND FATIGUE 12/12/2006    History reviewed. No pertinent surgical history.     Home Medications    Prior to Admission medications   Medication Sig Start Date End Date Taking? Authorizing Provider  predniSONE (DELTASONE) 20 MG tablet Take 2 tablets (40 mg total) by mouth daily with breakfast for 5 days. 06/18/22 06/23/22 Yes Shakyra Mattera-Warren, Alda Lea, NP  amoxicillin-clavulanate (AUGMENTIN) 875-125 MG tablet Take 1 tablet by mouth every 12 (twelve) hours. 06/02/22   Melynda Ripple, MD  fluticasone (FLONASE) 50 MCG/ACT nasal spray Place 2 sprays into both nostrils daily. 06/02/22    Melynda Ripple, MD  promethazine-dextromethorphan (PROMETHAZINE-DM) 6.25-15 MG/5ML syrup Take 5 mLs by mouth 4 (four) times daily as needed for cough. 06/02/22   Melynda Ripple, MD    Family History History reviewed. No pertinent family history.  Social History Social History   Tobacco Use   Smoking status: Never   Smokeless tobacco: Never  Vaping Use   Vaping Use: Never used  Substance Use Topics   Alcohol use: Never   Drug use: Never     Allergies   Patient has no known allergies.   Review of Systems Review of Systems Per HPI  Physical Exam Triage Vital Signs ED Triage Vitals  Enc Vitals Group     BP 06/18/22 1141 129/87     Pulse Rate 06/18/22 1141 67     Resp 06/18/22 1141 18     Temp 06/18/22 1141 97.8 F (36.6 C)     Temp Source 06/18/22 1141 Oral     SpO2 06/18/22 1141 95 %     Weight --      Height --      Head Circumference --      Peak Flow --      Pain Score 06/18/22 1143 6     Pain Loc --      Pain Edu? --      Excl. in [place]? --    No data found.  Updated Vital Signs BP 129/87 (BP Location: Right Arm)   Pulse 67  Temp 97.8 F (36.6 C) (Oral)   Resp 18   SpO2 95%   Visual Acuity Right Eye Distance:   Left Eye Distance:   Bilateral Distance:    Right Eye Near:   Left Eye Near:    Bilateral Near:     Physical Exam Vitals and nursing note reviewed.  Constitutional:      General: He is not in acute distress.    Appearance: Normal appearance.  HENT:     Head: Normocephalic.     Right Ear: Ear canal and external ear normal. A middle ear effusion is present.     Left Ear: Tympanic membrane, ear canal and external ear normal.     Nose: Congestion present. No rhinorrhea.     Right Turbinates: Enlarged and swollen.     Left Turbinates: Enlarged and swollen.     Right Sinus: No maxillary sinus tenderness or frontal sinus tenderness.     Left Sinus: No maxillary sinus tenderness or frontal sinus tenderness.     Mouth/Throat:      Lips: Pink.     Mouth: Mucous membranes are moist.     Pharynx: Oropharynx is clear. Uvula midline. Posterior oropharyngeal erythema present. No pharyngeal swelling or oropharyngeal exudate.     Comments: Cobblestoning present on posterior oropharynx Eyes:     Extraocular Movements: Extraocular movements intact.     Conjunctiva/sclera: Conjunctivae normal.     Pupils: Pupils are equal, round, and reactive to light.  Cardiovascular:     Rate and Rhythm: Normal rate and regular rhythm.     Pulses: Normal pulses.     Heart sounds: Normal heart sounds.  Pulmonary:     Effort: Pulmonary effort is normal.     Breath sounds: Normal breath sounds.  Abdominal:     General: Bowel sounds are normal.     Palpations: Abdomen is soft.  Musculoskeletal:     Cervical back: Normal range of motion.  Lymphadenopathy:     Cervical: No cervical adenopathy.  Skin:    General: Skin is warm and dry.  Neurological:     General: No focal deficit present.     Mental Status: He is alert and oriented to person, place, and time.  Psychiatric:        Mood and Affect: Mood normal.        Behavior: Behavior normal.      UC Treatments / Results  Labs (all labs ordered are listed, but only abnormal results are displayed) Labs Reviewed - No data to display  EKG   Radiology No results found.  Procedures Procedures (including critical care time)  Medications Ordered in UC Medications - No data to display  Initial Impression / Assessment and Plan / UC Course  I have reviewed the triage vital signs and the nursing notes.  Pertinent labs & imaging results that were available during my care of the patient were reviewed by me and considered in my medical decision making (see chart for details).  The patient is well-appearing, he is in no acute distress, vital signs are stable.  Symptoms consistent with a right eustachian tube dysfunction due to continued effusion in the right middle ear.  Will start  patient on prednisone 40 mg for the next 5 days to help with eustachian tube swelling.  Patient advised to continue use of Flonase that was previously prescribed.  Supportive care recommendations were provided to the patient along with indication of when follow-up may be necessary.  Patient verbalizes understanding.  All questions were answered.  Patient is stable for discharge.  Final Clinical Impressions(s) / UC Diagnoses   Final diagnoses:  Acute dysfunction of right eustachian tube     Discharge Instructions      Take medication as prescribed. May take over-the-counter Tylenol or ibuprofen as needed for pain or discomfort. Warm compresses to the affected ear help with comfort. Do not stick anything inside the ear while symptoms persist. Avoid getting water inside of the ear while symptoms persist. If symptoms fail to improve with this treatment, please follow-up with your primary care physician for further evaluation. Follow-up as needed.     ED Prescriptions     Medication Sig Dispense Auth. Provider   predniSONE (DELTASONE) 20 MG tablet Take 2 tablets (40 mg total) by mouth daily with breakfast for 5 days. 10 tablet Ketzaly Cardella-Warren, Alda Lea, NP      PDMP not reviewed this encounter.   Tish Men, NP 06/18/22 1214

## 2022-10-16 ENCOUNTER — Encounter: Payer: Self-pay | Admitting: *Deleted
